# Patient Record
Sex: Female | Born: 1995 | Race: White | Hispanic: No | Marital: Single | Smoking: Never smoker
Health system: Southern US, Community
[De-identification: ages and names within clinical notes are randomized; demographics above are authoritative.]

## PROBLEM LIST (undated history)

## (undated) DIAGNOSIS — J45909 Unspecified asthma, uncomplicated: Secondary | ICD-10-CM

## (undated) DIAGNOSIS — F32A Depression, unspecified: Secondary | ICD-10-CM

## (undated) DIAGNOSIS — Z8619 Personal history of other infectious and parasitic diseases: Secondary | ICD-10-CM

## (undated) DIAGNOSIS — G43009 Migraine without aura, not intractable, without status migrainosus: Secondary | ICD-10-CM

## (undated) DIAGNOSIS — F419 Anxiety disorder, unspecified: Secondary | ICD-10-CM

## (undated) DIAGNOSIS — M1990 Unspecified osteoarthritis, unspecified site: Secondary | ICD-10-CM

## (undated) DIAGNOSIS — T7840XA Allergy, unspecified, initial encounter: Secondary | ICD-10-CM

## (undated) HISTORY — DX: Depression, unspecified: F32.A

## (undated) HISTORY — DX: Allergy, unspecified, initial encounter: T78.40XA

## (undated) HISTORY — DX: Anxiety disorder, unspecified: F41.9

## (undated) HISTORY — DX: Personal history of other infectious and parasitic diseases: Z86.19

## (undated) HISTORY — DX: Unspecified asthma, uncomplicated: J45.909

## (undated) HISTORY — DX: Migraine without aura, not intractable, without status migrainosus: G43.009

---
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
## 2016-02-11 IMAGING — CR DG NECK SOFT TISSUE
2 series · 2 of 2 positions shown · non-contrast
Comparison: None.

CLINICAL DATA: Choking

EXAM:
NECK SOFT TISSUES - 1+ VIEW

[w soft tissue neck]
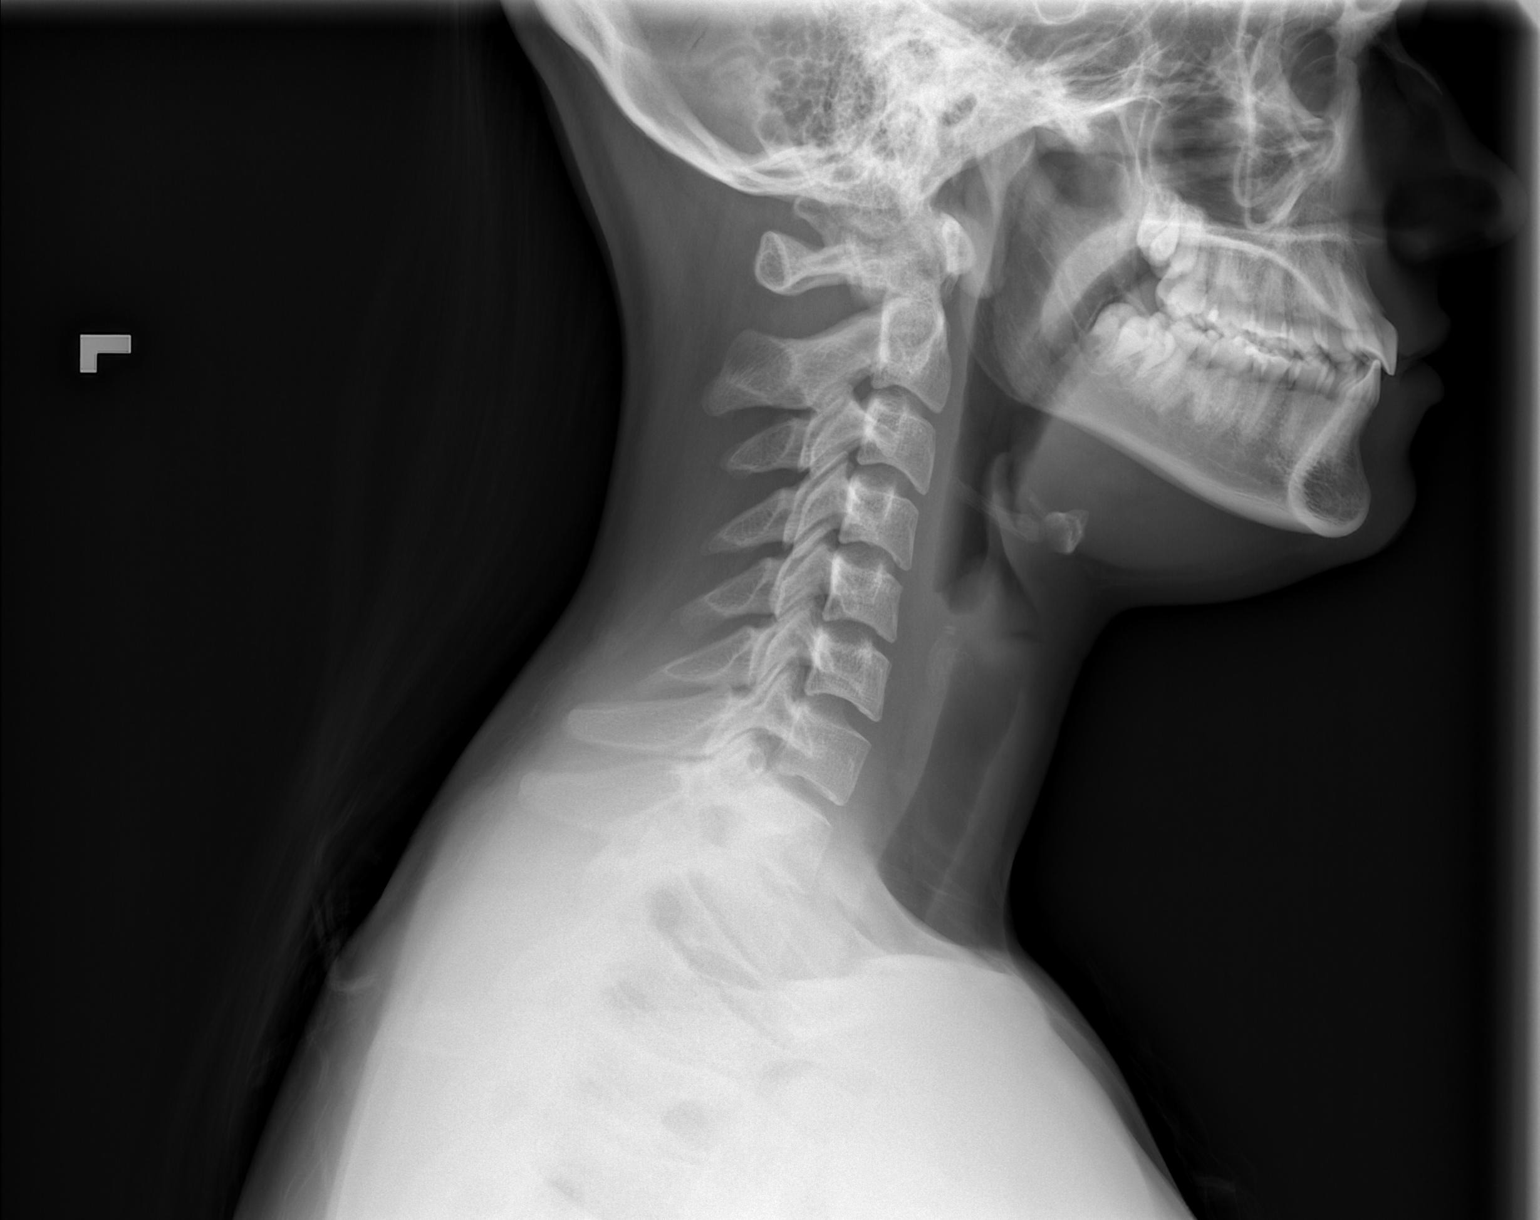

[w soft tissue neck ap]
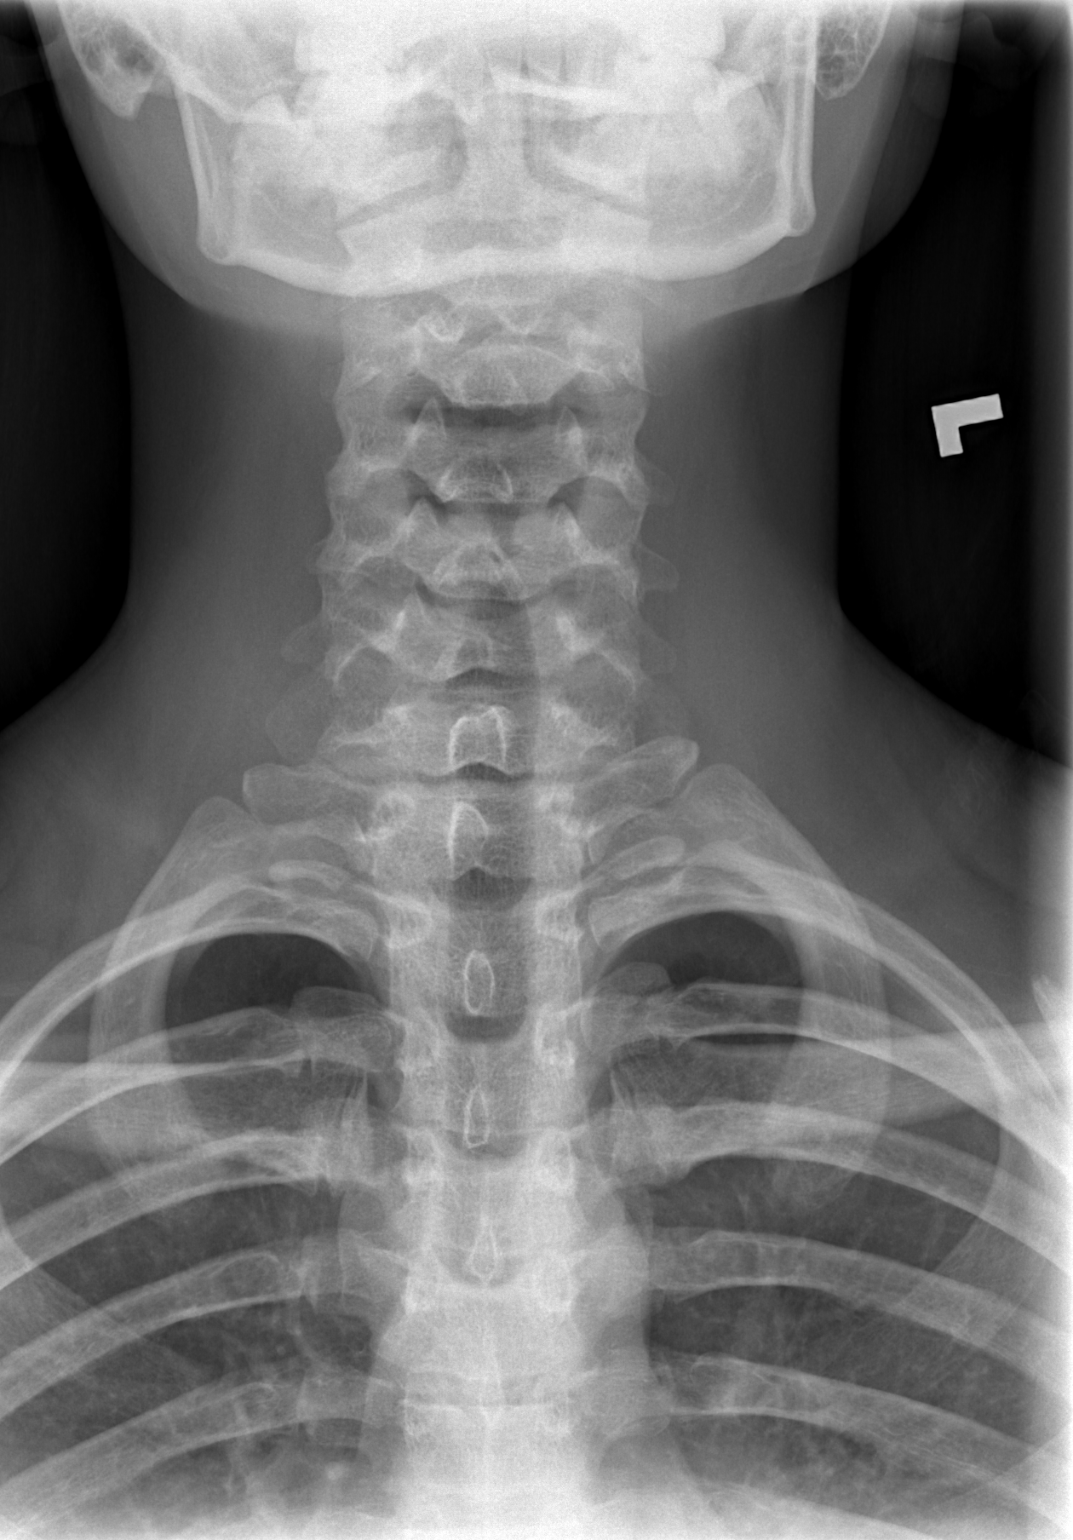

[2 of 2 positions shown; findings below may reference images not displayed]

FINDINGS: There is no evidence of retropharyngeal soft tissue swelling or
epiglottic enlargement. The cervical airway is unremarkable and no
radio-opaque foreign body identified.
IMPRESSION: Negative.

## 2016-02-14 HISTORY — PX: WISDOM TOOTH EXTRACTION: SHX21

## 2018-03-06 DIAGNOSIS — E669 Obesity, unspecified: Secondary | ICD-10-CM | POA: Diagnosis not present

## 2018-06-17 DIAGNOSIS — Z0000 Encounter for general adult medical examination without abnormal findings: Secondary | ICD-10-CM | POA: Diagnosis not present

## 2018-06-17 DIAGNOSIS — F411 Generalized anxiety disorder: Secondary | ICD-10-CM | POA: Diagnosis not present

## 2018-06-20 DIAGNOSIS — L700 Acne vulgaris: Secondary | ICD-10-CM | POA: Diagnosis not present

## 2018-07-09 DIAGNOSIS — M5416 Radiculopathy, lumbar region: Secondary | ICD-10-CM | POA: Diagnosis not present

## 2018-07-09 DIAGNOSIS — M545 Low back pain: Secondary | ICD-10-CM | POA: Diagnosis not present

## 2018-07-10 DIAGNOSIS — E531 Pyridoxine deficiency: Secondary | ICD-10-CM | POA: Diagnosis not present

## 2018-07-10 DIAGNOSIS — R7309 Other abnormal glucose: Secondary | ICD-10-CM | POA: Diagnosis not present

## 2018-07-10 DIAGNOSIS — Z13220 Encounter for screening for lipoid disorders: Secondary | ICD-10-CM | POA: Diagnosis not present

## 2018-07-10 DIAGNOSIS — K9041 Non-celiac gluten sensitivity: Secondary | ICD-10-CM | POA: Diagnosis not present

## 2018-07-10 DIAGNOSIS — L659 Nonscarring hair loss, unspecified: Secondary | ICD-10-CM | POA: Diagnosis not present

## 2018-07-10 DIAGNOSIS — E538 Deficiency of other specified B group vitamins: Secondary | ICD-10-CM | POA: Diagnosis not present

## 2018-07-10 DIAGNOSIS — E559 Vitamin D deficiency, unspecified: Secondary | ICD-10-CM | POA: Diagnosis not present

## 2018-07-10 DIAGNOSIS — R5383 Other fatigue: Secondary | ICD-10-CM | POA: Diagnosis not present

## 2018-07-10 DIAGNOSIS — Z6836 Body mass index (BMI) 36.0-36.9, adult: Secondary | ICD-10-CM | POA: Diagnosis not present

## 2018-07-10 DIAGNOSIS — Z0000 Encounter for general adult medical examination without abnormal findings: Secondary | ICD-10-CM | POA: Diagnosis not present

## 2018-07-10 DIAGNOSIS — E669 Obesity, unspecified: Secondary | ICD-10-CM | POA: Diagnosis not present

## 2018-07-10 DIAGNOSIS — R350 Frequency of micturition: Secondary | ICD-10-CM | POA: Diagnosis not present

## 2018-07-16 DIAGNOSIS — M6281 Muscle weakness (generalized): Secondary | ICD-10-CM | POA: Diagnosis not present

## 2018-07-16 DIAGNOSIS — M9903 Segmental and somatic dysfunction of lumbar region: Secondary | ICD-10-CM | POA: Diagnosis not present

## 2018-07-16 DIAGNOSIS — M9904 Segmental and somatic dysfunction of sacral region: Secondary | ICD-10-CM | POA: Diagnosis not present

## 2018-07-17 DIAGNOSIS — R2231 Localized swelling, mass and lump, right upper limb: Secondary | ICD-10-CM | POA: Diagnosis not present

## 2018-07-17 DIAGNOSIS — R2232 Localized swelling, mass and lump, left upper limb: Secondary | ICD-10-CM | POA: Diagnosis not present

## 2018-07-25 DIAGNOSIS — M9904 Segmental and somatic dysfunction of sacral region: Secondary | ICD-10-CM | POA: Diagnosis not present

## 2018-07-25 DIAGNOSIS — M6281 Muscle weakness (generalized): Secondary | ICD-10-CM | POA: Diagnosis not present

## 2018-07-30 DIAGNOSIS — M2550 Pain in unspecified joint: Secondary | ICD-10-CM | POA: Diagnosis not present

## 2018-08-07 DIAGNOSIS — R269 Unspecified abnormalities of gait and mobility: Secondary | ICD-10-CM | POA: Diagnosis not present

## 2018-08-07 DIAGNOSIS — M9903 Segmental and somatic dysfunction of lumbar region: Secondary | ICD-10-CM | POA: Diagnosis not present

## 2018-08-07 DIAGNOSIS — M9904 Segmental and somatic dysfunction of sacral region: Secondary | ICD-10-CM | POA: Diagnosis not present

## 2018-08-07 DIAGNOSIS — R262 Difficulty in walking, not elsewhere classified: Secondary | ICD-10-CM | POA: Diagnosis not present

## 2018-08-20 DIAGNOSIS — M5416 Radiculopathy, lumbar region: Secondary | ICD-10-CM | POA: Diagnosis not present

## 2018-08-20 DIAGNOSIS — M545 Low back pain: Secondary | ICD-10-CM | POA: Diagnosis not present

## 2018-09-05 DIAGNOSIS — Z01419 Encounter for gynecological examination (general) (routine) without abnormal findings: Secondary | ICD-10-CM | POA: Diagnosis not present

## 2018-09-16 DIAGNOSIS — Z23 Encounter for immunization: Secondary | ICD-10-CM | POA: Diagnosis not present

## 2018-10-10 DIAGNOSIS — M5416 Radiculopathy, lumbar region: Secondary | ICD-10-CM | POA: Diagnosis not present

## 2018-10-10 DIAGNOSIS — N3941 Urge incontinence: Secondary | ICD-10-CM | POA: Diagnosis not present

## 2018-10-18 DIAGNOSIS — Z23 Encounter for immunization: Secondary | ICD-10-CM | POA: Diagnosis not present

## 2018-10-25 DIAGNOSIS — R42 Dizziness and giddiness: Secondary | ICD-10-CM | POA: Diagnosis not present

## 2018-10-25 DIAGNOSIS — R002 Palpitations: Secondary | ICD-10-CM | POA: Diagnosis not present

## 2018-11-04 DIAGNOSIS — F411 Generalized anxiety disorder: Secondary | ICD-10-CM | POA: Diagnosis not present

## 2018-11-06 DIAGNOSIS — F411 Generalized anxiety disorder: Secondary | ICD-10-CM | POA: Diagnosis not present

## 2018-11-14 DIAGNOSIS — F411 Generalized anxiety disorder: Secondary | ICD-10-CM | POA: Diagnosis not present

## 2018-11-14 DIAGNOSIS — H43811 Vitreous degeneration, right eye: Secondary | ICD-10-CM | POA: Diagnosis not present

## 2018-11-28 DIAGNOSIS — F900 Attention-deficit hyperactivity disorder, predominantly inattentive type: Secondary | ICD-10-CM | POA: Diagnosis not present

## 2018-12-05 DIAGNOSIS — F900 Attention-deficit hyperactivity disorder, predominantly inattentive type: Secondary | ICD-10-CM | POA: Diagnosis not present

## 2018-12-24 DIAGNOSIS — G8911 Acute pain due to trauma: Secondary | ICD-10-CM | POA: Diagnosis not present

## 2018-12-24 DIAGNOSIS — R1031 Right lower quadrant pain: Secondary | ICD-10-CM | POA: Diagnosis not present

## 2018-12-24 DIAGNOSIS — K589 Irritable bowel syndrome without diarrhea: Secondary | ICD-10-CM | POA: Diagnosis not present

## 2018-12-24 DIAGNOSIS — R509 Fever, unspecified: Secondary | ICD-10-CM | POA: Diagnosis not present

## 2018-12-24 DIAGNOSIS — R1033 Periumbilical pain: Secondary | ICD-10-CM | POA: Diagnosis not present

## 2018-12-24 DIAGNOSIS — R1084 Generalized abdominal pain: Secondary | ICD-10-CM | POA: Diagnosis not present

## 2018-12-31 DIAGNOSIS — R109 Unspecified abdominal pain: Secondary | ICD-10-CM | POA: Diagnosis not present

## 2019-01-01 DIAGNOSIS — F909 Attention-deficit hyperactivity disorder, unspecified type: Secondary | ICD-10-CM | POA: Diagnosis not present

## 2019-03-04 DIAGNOSIS — F909 Attention-deficit hyperactivity disorder, unspecified type: Secondary | ICD-10-CM | POA: Diagnosis not present

## 2019-03-17 DIAGNOSIS — Z03818 Encounter for observation for suspected exposure to other biological agents ruled out: Secondary | ICD-10-CM | POA: Diagnosis not present

## 2019-03-20 DIAGNOSIS — Z23 Encounter for immunization: Secondary | ICD-10-CM | POA: Diagnosis not present

## 2019-03-27 DIAGNOSIS — F909 Attention-deficit hyperactivity disorder, unspecified type: Secondary | ICD-10-CM | POA: Diagnosis not present

## 2019-04-01 DIAGNOSIS — M25559 Pain in unspecified hip: Secondary | ICD-10-CM | POA: Diagnosis not present

## 2019-04-01 DIAGNOSIS — M2550 Pain in unspecified joint: Secondary | ICD-10-CM | POA: Diagnosis not present

## 2019-04-01 DIAGNOSIS — R300 Dysuria: Secondary | ICD-10-CM | POA: Diagnosis not present

## 2019-04-01 DIAGNOSIS — R5383 Other fatigue: Secondary | ICD-10-CM | POA: Diagnosis not present

## 2019-04-17 DIAGNOSIS — F909 Attention-deficit hyperactivity disorder, unspecified type: Secondary | ICD-10-CM | POA: Diagnosis not present

## 2019-04-25 DIAGNOSIS — M79642 Pain in left hand: Secondary | ICD-10-CM | POA: Diagnosis not present

## 2019-04-25 DIAGNOSIS — M2550 Pain in unspecified joint: Secondary | ICD-10-CM | POA: Diagnosis not present

## 2019-04-25 DIAGNOSIS — M79641 Pain in right hand: Secondary | ICD-10-CM | POA: Diagnosis not present

## 2019-04-25 DIAGNOSIS — M25511 Pain in right shoulder: Secondary | ICD-10-CM | POA: Diagnosis not present

## 2019-05-05 DIAGNOSIS — J029 Acute pharyngitis, unspecified: Secondary | ICD-10-CM | POA: Diagnosis not present

## 2019-05-15 DIAGNOSIS — F909 Attention-deficit hyperactivity disorder, unspecified type: Secondary | ICD-10-CM | POA: Diagnosis not present

## 2019-05-15 DIAGNOSIS — J309 Allergic rhinitis, unspecified: Secondary | ICD-10-CM | POA: Diagnosis not present

## 2019-05-20 DIAGNOSIS — L02214 Cutaneous abscess of groin: Secondary | ICD-10-CM | POA: Diagnosis not present

## 2019-06-05 DIAGNOSIS — M79672 Pain in left foot: Secondary | ICD-10-CM | POA: Diagnosis not present

## 2019-06-05 DIAGNOSIS — B351 Tinea unguium: Secondary | ICD-10-CM | POA: Diagnosis not present

## 2019-06-05 DIAGNOSIS — M79671 Pain in right foot: Secondary | ICD-10-CM | POA: Diagnosis not present

## 2019-06-17 DIAGNOSIS — R1012 Left upper quadrant pain: Secondary | ICD-10-CM | POA: Diagnosis not present

## 2019-06-17 DIAGNOSIS — K30 Functional dyspepsia: Secondary | ICD-10-CM | POA: Diagnosis not present

## 2019-06-17 DIAGNOSIS — R198 Other specified symptoms and signs involving the digestive system and abdomen: Secondary | ICD-10-CM | POA: Diagnosis not present

## 2019-06-17 DIAGNOSIS — M2550 Pain in unspecified joint: Secondary | ICD-10-CM | POA: Diagnosis not present

## 2019-07-02 DIAGNOSIS — K30 Functional dyspepsia: Secondary | ICD-10-CM | POA: Diagnosis not present

## 2019-07-02 DIAGNOSIS — R1012 Left upper quadrant pain: Secondary | ICD-10-CM | POA: Diagnosis not present

## 2019-07-02 DIAGNOSIS — R198 Other specified symptoms and signs involving the digestive system and abdomen: Secondary | ICD-10-CM | POA: Diagnosis not present

## 2019-07-09 DIAGNOSIS — R109 Unspecified abdominal pain: Secondary | ICD-10-CM | POA: Diagnosis not present

## 2019-07-15 HISTORY — PX: CHOLECYSTECTOMY: SHX55

## 2019-07-18 DIAGNOSIS — Z23 Encounter for immunization: Secondary | ICD-10-CM | POA: Diagnosis not present

## 2019-07-23 DIAGNOSIS — K811 Chronic cholecystitis: Secondary | ICD-10-CM | POA: Diagnosis not present

## 2019-07-23 DIAGNOSIS — Z01818 Encounter for other preprocedural examination: Secondary | ICD-10-CM | POA: Diagnosis not present

## 2019-08-01 DIAGNOSIS — K219 Gastro-esophageal reflux disease without esophagitis: Secondary | ICD-10-CM | POA: Diagnosis not present

## 2019-08-01 DIAGNOSIS — J45909 Unspecified asthma, uncomplicated: Secondary | ICD-10-CM | POA: Diagnosis not present

## 2019-08-01 DIAGNOSIS — F419 Anxiety disorder, unspecified: Secondary | ICD-10-CM | POA: Diagnosis not present

## 2019-08-01 DIAGNOSIS — K828 Other specified diseases of gallbladder: Secondary | ICD-10-CM | POA: Diagnosis not present

## 2019-08-01 DIAGNOSIS — K811 Chronic cholecystitis: Secondary | ICD-10-CM | POA: Diagnosis not present

## 2019-08-01 DIAGNOSIS — K824 Cholesterolosis of gallbladder: Secondary | ICD-10-CM | POA: Diagnosis not present

## 2019-08-01 DIAGNOSIS — Z79899 Other long term (current) drug therapy: Secondary | ICD-10-CM | POA: Diagnosis not present

## 2019-09-12 DIAGNOSIS — F411 Generalized anxiety disorder: Secondary | ICD-10-CM | POA: Diagnosis not present

## 2019-09-12 DIAGNOSIS — F909 Attention-deficit hyperactivity disorder, unspecified type: Secondary | ICD-10-CM | POA: Diagnosis not present

## 2019-10-08 DIAGNOSIS — R519 Headache, unspecified: Secondary | ICD-10-CM | POA: Diagnosis not present

## 2019-10-08 DIAGNOSIS — Z20822 Contact with and (suspected) exposure to covid-19: Secondary | ICD-10-CM | POA: Diagnosis not present

## 2019-10-17 DIAGNOSIS — L700 Acne vulgaris: Secondary | ICD-10-CM | POA: Diagnosis not present

## 2021-02-17 ENCOUNTER — Telehealth: Payer: Self-pay

## 2021-02-17 NOTE — Telephone Encounter (Signed)
Can patient be worked in sooner for New patient appt due to just moving her and is out of all her medications.

## 2021-02-17 NOTE — Telephone Encounter (Signed)
Patient worked in on 1/17.

## 2021-02-28 ENCOUNTER — Ambulatory Visit: Payer: PRIVATE HEALTH INSURANCE | Admitting: Family Medicine

## 2021-03-01 ENCOUNTER — Other Ambulatory Visit: Payer: Self-pay

## 2021-03-01 ENCOUNTER — Ambulatory Visit (INDEPENDENT_AMBULATORY_CARE_PROVIDER_SITE_OTHER): Payer: PRIVATE HEALTH INSURANCE | Admitting: Family Medicine

## 2021-03-01 ENCOUNTER — Encounter: Payer: Self-pay | Admitting: Family Medicine

## 2021-03-01 VITALS — BP 118/70 | HR 67 | Temp 97.9°F | Ht 63.0 in | Wt 204.2 lb

## 2021-03-01 DIAGNOSIS — J4520 Mild intermittent asthma, uncomplicated: Secondary | ICD-10-CM | POA: Insufficient documentation

## 2021-03-01 DIAGNOSIS — J309 Allergic rhinitis, unspecified: Secondary | ICD-10-CM | POA: Insufficient documentation

## 2021-03-01 DIAGNOSIS — F4323 Adjustment disorder with mixed anxiety and depressed mood: Secondary | ICD-10-CM

## 2021-03-01 DIAGNOSIS — G43709 Chronic migraine without aura, not intractable, without status migrainosus: Secondary | ICD-10-CM | POA: Diagnosis not present

## 2021-03-01 DIAGNOSIS — F902 Attention-deficit hyperactivity disorder, combined type: Secondary | ICD-10-CM

## 2021-03-01 MED ORDER — SUMATRIPTAN SUCCINATE 50 MG PO TABS: 50.0000 mg | ORAL_TABLET | ORAL | 3 refills | Status: AC | PRN

## 2021-03-01 MED ORDER — TOPIRAMATE 25 MG PO TABS: 25.0000 mg | ORAL_TABLET | Freq: Two times a day (BID) | ORAL | 3 refills | Status: AC

## 2021-03-01 MED ORDER — MONTELUKAST SODIUM 10 MG PO TABS: 10.0000 mg | ORAL_TABLET | Freq: Every day | ORAL | 3 refills | Status: AC

## 2021-03-01 MED ORDER — AMPHETAMINE-DEXTROAMPHET ER 10 MG PO CP24: 10.0000 mg | ORAL_CAPSULE | Freq: Every day | ORAL | 0 refills | Status: AC

## 2021-03-01 NOTE — Progress Notes (Signed)
New Patient Office Visit  Subjective:  Patient ID: Jamie Winters, female    DOB: 1996/01/15  Age: 26 y.o. 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  Moved to [place] then back   Asthma/allergy-worse w/exercise and URI.  Not using/needing MDI.  Anxiety/depression-seeing NP psych-no SI.  Wants to get into counseling ADHD-dx 2019-dx w/[facility] eval in [place].-Adderall working well, but out for 2.5 wks.  Not take daily-more for work.  Saw rx on pt phone Went to [facility] for contraception and ? PCOS- but they told prolactin high and thyroid off so referred to endo. Appt 2/21. Migraine-dx Nov 2021.  Was on Topamax and did well but concerned w/birth control.  Tylenol/motrin not help.  Gets 2x/wk.  No auras.    Past Medical History:  Diagnosis Date   Allergy    Anxiety    Arthritis    Asthma    Depression    History of Lyme disease    Migraine without aura and without status migrainosus, not intractable     Past Surgical History:  Procedure Laterality Date   CHOLECYSTECTOMY  07/2019   WISDOM TOOTH EXTRACTION Bilateral 2018    Family History  Problem Relation Age of Onset   Drug abuse Mother    Alcohol abuse Mother    Arthritis Father    Early death Brother    Mental illness Brother    Hypertension Paternal Grandmother    Hyperlipidemia Paternal Grandmother    Heart disease Paternal Grandmother    Diabetes Paternal Grandmother    Cancer Paternal Grandmother    Heart attack Paternal Grandmother    Birth defects Paternal Grandfather    Arthritis Paternal Grandfather    Cancer Paternal Grandfather    Hypertension Paternal Grandfather     Social History   Socioeconomic History   Marital status: Single    Spouse name: Not on file   Number of children: 0   Years of education: Not on file   Highest education level: Not on file  Occupational History   Not on file  Tobacco Use   Smoking  status: Never   Smokeless tobacco: Never  Vaping Use   Vaping Use: Never used  Substance and Sexual Activity   Alcohol use: Yes    Comment: social   Drug use: Never   Sexual activity: Yes    Birth control/protection: Patch  Other Topics Concern   Not on file  Social History Narrative   [employer]   Social Determinants of Health   Financial Resource Strain: Not on file  Food Insecurity: Not on file  Transportation Needs: Not on file  Physical Activity: Not on file  Stress: Not on file  Social Connections: Not on file  Intimate Partner Violence: Not on file    ROS: negative/noncontributory except as in HPI Some bloating.  Objective:   Today's Vitals: BP 118/70    Pulse 67    Temp 97.9 F (36.6 C) (Temporal)    Ht 5\' 3"  (1.6 m)    Wt 204 lb 4 oz (92.6 kg)    LMP 02/14/2021 (Exact Date) Comment: has been on for 2 weeks   SpO2 97%    BMI 36.18 kg/m   Gen: WDWN NAD [facility] HEENT: NCAT, conjunctiva not injected, sclera nonicteric TM WNL B, OP moist, no exudates  NECK:  supple, no thyromegaly, no nodes, no carotid bruits  CARDIAC: RRR, S1S2+, no murmur. DP 2+B LUNGS: CTAB. No wheezes ABDOMEN:  BS+, soft, NTND, No HSM, no masses EXT:  no edema MSK: no gross abnormalities.  NEURO: A&O x3.  CN II-XII intact.  PSYCH: normal mood. Good eye contact   Assessment & Plan:   Problem List Items Addressed This Visit       Cardiovascular and Mediastinum   Chronic migraine without aura without status migrainosus, not intractable   Relevant Medications   FLUoxetine (PROZAC) 20 MG tablet   SUMAtriptan (IMITREX) 50 MG tablet   topiramate (TOPAMAX) 25 MG tablet     Respiratory   Allergic rhinitis   Mild intermittent asthma without complication   Relevant Medications   montelukast (SINGULAIR) 10 MG tablet     Other   Attention deficit hyperactivity disorder (ADHD), combined type - Primary   Relevant Orders   DRUG MONITORING, PANEL 8 WITH CONFIRMATION, URINE   Other Visit Diagnoses      Adjustment disorder with mixed anxiety and depressed mood          Migraine-no aura-gets at least twice weekly.  On contraception now so would like to get back on topamax.  Also, sumitriptan for breakthru-advised not to overuse as on SSRI.  Try Mg 400mg  daily as well Allergies/asthma-stable on montelukast and antihist-cont.   ADHD-reviewed pt phone-[employer] pham in [place]-renewed.  Bring copy psych report.  Check UDS Adjustment disorder mixed-on prozac-seeing MH NP.  Brochure for counseling given to pt. Abnorml TSH-seeing endo next month F/u 3 mo    Outpatient Encounter Medications as of 03/01/2021  Medication Sig   cetirizine (ZYRTEC) 10 MG tablet 1 tablet   FLUoxetine (PROZAC) 20 MG tablet Take 20 mg by mouth daily.   SUMAtriptan (IMITREX) 50 MG tablet Take 1 tablet (50 mg total) by mouth every 2 (two) hours as needed for migraine. May repeat in 2 hours if headache persists or recurs.   topiramate (TOPAMAX) 25 MG tablet Take 1 tablet (25 mg total) by mouth 2 (two) times daily.   ZAFEMY 150-35 MCG/24HR transdermal patch 1 patch once a week.   [DISCONTINUED] amphetamine-dextroamphetamine (ADDERALL XR) 10 MG 24 hr capsule    [DISCONTINUED] montelukast (SINGULAIR) 10 MG tablet Take 10 mg by mouth at bedtime.   amphetamine-dextroamphetamine (ADDERALL XR) 10 MG 24 hr capsule Take 1 capsule (10 mg total) by mouth daily.   montelukast (SINGULAIR) 10 MG tablet Take 1 tablet (10 mg total) by mouth at bedtime.   No facility-administered encounter medications on file as of 03/01/2021.    Follow-up: Return in about 3 months (around 05/30/2021) for migraine, ADD.   06/01/2021, MD

## 2021-03-01 NOTE — Patient Instructions (Signed)
Welcome to [facility] at [facility] [place]! It was a pleasure meeting you today.  As discussed, Please schedule a 3 month follow up visit today.  PLEASE NOTE:  If you had any LAB tests please let us know if you have not heard back within a few days. You may see your results on MyChart before we have a chance to review them but we will give you a call once they are reviewed by us. If we ordered any REFERRALS today, please let us know if you have not heard from their office within the next week.  Let us know through MyChart if you are needing REFILLS, or have your pharmacy send us the request. You can also use MyChart to communicate with me or any office staff.  Please try these tips to maintain a healthy lifestyle:  Eat most of your calories during the day when you are active. Eliminate processed foods including packaged sweets (pies, cakes, cookies), reduce intake of potatoes, white bread, white pasta, and white rice. Look for whole grain options, oat flour or almond flour.  Each meal should contain half fruits/vegetables, one quarter protein, and one quarter carbs (no bigger than a computer mouse).  Cut down on sweet beverages. This includes juice, soda, and sweet tea. Also watch fruit intake, though this is a healthier sweet option, it still contains natural sugar! Limit to 3 servings daily.  Drink at least 1 glass of water with each meal and aim for at least 8 glasses per day  Exercise at least 150 minutes every week.   

## 2021-03-02 LAB — DRUG MONITORING, PANEL 8 WITH CONFIRMATION, URINE
6 Acetylmorphine: NEGATIVE ng/mL (ref <10)
Alcohol Metabolites: NEGATIVE ng/mL (ref <500)
Amphetamines: NEGATIVE ng/mL (ref <500)
Benzodiazepines: NEGATIVE ng/mL (ref <100)
Buprenorphine, Urine: NEGATIVE ng/mL (ref <5)
Cocaine Metabolite: NEGATIVE ng/mL (ref <150)
Creatinine: 268.6 mg/dL (ref >=20.0)
MDMA: NEGATIVE ng/mL (ref <500)
Marijuana Metabolite: NEGATIVE ng/mL (ref <20)
Opiates: NEGATIVE ng/mL (ref <100)
Oxidant: NEGATIVE ug/mL (ref <200)
Oxycodone: NEGATIVE ng/mL (ref <100)
pH: 5.6 (ref 4.5–9.0)

## 2021-03-02 LAB — DM TEMPLATE

## 2021-03-31 ENCOUNTER — Other Ambulatory Visit: Payer: Self-pay | Admitting: Family Medicine

## 2021-03-31 MED ORDER — AMPHETAMINE-DEXTROAMPHET ER 10 MG PO CP24: 10.0000 mg | ORAL_CAPSULE | ORAL | 0 refills | Status: AC

## 2021-03-31 MED ORDER — AMPHETAMINE-DEXTROAMPHET ER 10 MG PO CP24
10.0000 mg | ORAL_CAPSULE | ORAL | 0 refills | Status: AC
Start: 2021-03-31 — End: 2021-04-30

## 2021-03-31 NOTE — Telephone Encounter (Signed)
Needs a refill for adderall- pharmacy on file correct.

## 2021-05-30 ENCOUNTER — Ambulatory Visit: Payer: PRIVATE HEALTH INSURANCE | Admitting: Family Medicine

## 2022-09-25 ENCOUNTER — Telehealth: Payer: Self-pay | Admitting: Family Medicine

## 2022-09-25 NOTE — Telephone Encounter (Signed)
Number is not in service.
